# Patient Record
Sex: Male | Born: 1985 | Race: Black or African American | Hispanic: No | Marital: Single | Smoking: Current every day smoker
Health system: Southern US, Community
[De-identification: ages and names within clinical notes are randomized; demographics above are authoritative.]

## PROBLEM LIST (undated history)

## (undated) DIAGNOSIS — G43909 Migraine, unspecified, not intractable, without status migrainosus: Secondary | ICD-10-CM

## (undated) DIAGNOSIS — Z973 Presence of spectacles and contact lenses: Secondary | ICD-10-CM

## (undated) HISTORY — DX: Migraine, unspecified, not intractable, without status migrainosus: G43.909

## (undated) HISTORY — DX: Presence of spectacles and contact lenses: Z97.3

---
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
## 2007-07-13 ENCOUNTER — Emergency Department (HOSPITAL_COMMUNITY): Admission: EM | Admit: 2007-07-13 | Discharge: 2007-07-13 | Payer: Self-pay | Admitting: Emergency Medicine

## 2008-07-22 ENCOUNTER — Emergency Department (HOSPITAL_COMMUNITY): Admission: EM | Admit: 2008-07-22 | Discharge: 2008-07-22 | Payer: Self-pay | Admitting: Family Medicine

## 2009-09-13 ENCOUNTER — Emergency Department (HOSPITAL_COMMUNITY): Admission: EM | Admit: 2009-09-13 | Discharge: 2009-09-13 | Payer: Self-pay | Admitting: Emergency Medicine

## 2009-10-10 ENCOUNTER — Emergency Department (HOSPITAL_COMMUNITY): Admission: EM | Admit: 2009-10-10 | Discharge: 2009-10-10 | Payer: Self-pay | Admitting: Emergency Medicine

## 2011-01-23 ENCOUNTER — Emergency Department (HOSPITAL_COMMUNITY)
Admission: EM | Admit: 2011-01-23 | Discharge: 2011-01-23 | Disposition: A | Discharge status: Home / Self Care | Payer: Self-pay | Attending: Emergency Medicine | Admitting: Emergency Medicine

## 2011-01-23 DIAGNOSIS — F17200 Nicotine dependence, unspecified, uncomplicated: Secondary | ICD-10-CM | POA: Insufficient documentation

## 2011-01-23 DIAGNOSIS — K029 Dental caries, unspecified: Secondary | ICD-10-CM | POA: Insufficient documentation

## 2011-01-23 DIAGNOSIS — Z79899 Other long term (current) drug therapy: Secondary | ICD-10-CM | POA: Insufficient documentation

## 2011-01-23 DIAGNOSIS — F1210 Cannabis abuse, uncomplicated: Secondary | ICD-10-CM | POA: Insufficient documentation

## 2011-12-14 ENCOUNTER — Encounter (HOSPITAL_COMMUNITY): Payer: Self-pay | Admitting: *Deleted

## 2011-12-14 ENCOUNTER — Emergency Department (HOSPITAL_COMMUNITY)
Admission: EM | Admit: 2011-12-14 | Discharge: 2011-12-14 | Disposition: A | Discharge status: Home / Self Care | Payer: Self-pay | Attending: Emergency Medicine | Admitting: Emergency Medicine

## 2011-12-14 DIAGNOSIS — L0889 Other specified local infections of the skin and subcutaneous tissue: Secondary | ICD-10-CM | POA: Insufficient documentation

## 2011-12-14 DIAGNOSIS — K130 Diseases of lips: Secondary | ICD-10-CM

## 2011-12-14 DIAGNOSIS — F17200 Nicotine dependence, unspecified, uncomplicated: Secondary | ICD-10-CM | POA: Insufficient documentation

## 2011-12-14 MED ORDER — HYDROCODONE-ACETAMINOPHEN 5-325 MG PO TABS: ORAL_TABLET | ORAL | Status: AC

## 2011-12-14 MED ORDER — CEPHALEXIN 500 MG PO CAPS: 500.0000 mg | ORAL_CAPSULE | Freq: Four times a day (QID) | ORAL | Status: AC

## 2011-12-14 NOTE — ED Notes (Signed)
Pt reports abscess to upper lip x 2 days. No fever/chills. Also reports pain to right lower side of mouth.

## 2011-12-14 NOTE — ED Provider Notes (Signed)
History     CSN: [id number]  Arrival date & time 12/14/11  0818   First MD Initiated Contact with Patient 12/14/11 [phone]      Chief Complaint  Patient presents with  . Abscess    (Consider location/radiation/quality/duration/timing/severity/associated sxs/prior treatment) HPI Comments: Patient presents with complaint of abscess to his upper lip for the past 2 days. Patient has noticed increasing swelling and tenderness in his lip. He also had some dental pain in the left upper first incisor. Patient denies fever, nausea or vomiting, neck swelling, trouble breathing. Treated with ibuprofen with mild relief of pain. Onset was gradual. Course is gradually worsening. Nothing makes the pain better or worse. Patient has a history of dental abscess.  Patient is a 26 y.o. male presenting with abscess. The history is provided by the patient.  Abscess  This is a new problem. The current episode started yesterday. The onset was gradual. The problem has been gradually worsening. The problem is moderate. The abscess is characterized by painfulness. Pertinent negatives include no fever, no vomiting and no sore throat.    History reviewed. No pertinent past medical history.  History reviewed. No pertinent past surgical history.  No family history on file.  History  Substance Use Topics  . Smoking status: Current Everyday Smoker  . Smokeless tobacco: Not on file  . Alcohol Use: Yes      Review of Systems  Constitutional: Negative for fever.  HENT: Positive for dental problem. Negative for sore throat.   Respiratory: Negative for shortness of breath.   Gastrointestinal: Negative for nausea and vomiting.  Skin: Negative for color change.       Positive for abscess  Hematological: Negative for adenopathy.    Allergies  Review of patient's allergies indicates no known allergies.  Home Medications  No current outpatient prescriptions on file.  BP 133/80  Pulse 85  Temp 98.1 F (36.7  C) (Oral)  Resp 20  SpO2 98%  Physical Exam  Nursing note and vitals reviewed. Constitutional: He appears well-developed and well-nourished.  HENT:  Head: Normocephalic and atraumatic.  Mouth/Throat: Oropharynx is clear and moist and mucous membranes are normal. Dental abscesses present.       Patient with swelling to inner upper lip, midline, with 2cm area of soft tissue swelling and erythema. No definite induration.   Eyes: Conjunctivae are normal.  Neck: Normal range of motion. Neck supple.  Pulmonary/Chest: No respiratory distress.  Neurological: He is alert.  Skin: Skin is warm and dry.  Psychiatric: He has a normal mood and affect.    ED Course  Procedures (including critical care time)  Labs Reviewed - No data to display No results found.   1. Infection of lip     8:40 AM Patient seen and examined.   Vital signs reviewed and are as follows: Filed Vitals:   12/14/11 0822  BP: 133/80  Pulse: 85  Temp: 98.1 F (36.7 C)  Resp: 20   NEEDLE ASPIRATION Performed by: Carolee Rota Consent: Verbal consent obtained. Risks and benefits: risks, benefits and alternatives were discussed Type: abscess  Body area: upper lip  Anesthesia: local infiltration  Local anesthetic: lidocaine 2% without epinephrine  Anesthetic total: 1.5 ml  Needle: 18g  Drainage: none  Patient tolerance: Patient tolerated the procedure well with no immediate complications.  9:12 AM The patient was urged to return to the Emergency Department urgently with worsening pain, swelling, expanding erythema especially if it streaks away from the affected area, fever, or  if they have any other concerns.   The patient was urged to return to the Emergency Department or go to their PCP in 48 hours for wound recheck if the area is not significantly improved.  The patient verbalized understanding and stated agreement with this plan.   9:12 AM Patient counseled on use of narcotic pain medications.  Counseled not to combine these medications with others containing tylenol. Urged not to drink alcohol, drive, or perform any other activities that requires focus while taking these medications. The patient verbalizes understanding and agrees with the plan.   MDM  Upper lip infection. Needle aspiration did not produce any drainage. Possibly early abscess vs soft tissue infection. Will treat with keflex, pain medicine, Appropriate return instructions given.         [place], [place] 12/14/11 [phone]

## 2011-12-16 NOTE — ED Provider Notes (Signed)
Medical screening examination/treatment/procedure(s) were performed by non-physician practitioner and as supervising physician I was immediately available for consultation/collaboration.  Taleigha Pinson, MD 12/16/11 1707 

## 2012-08-23 ENCOUNTER — Ambulatory Visit (INDEPENDENT_AMBULATORY_CARE_PROVIDER_SITE_OTHER): Payer: BC Managed Care – PPO | Admitting: Medical

## 2012-08-23 ENCOUNTER — Encounter: Payer: Self-pay | Admitting: Medical

## 2012-08-23 VITALS — BP 110/70 | HR 82 | Temp 98.0°F | Resp 16 | Wt 200.0 lb

## 2012-08-23 DIAGNOSIS — B029 Zoster without complications: Secondary | ICD-10-CM

## 2012-08-23 MED ORDER — VALACYCLOVIR HCL 1 G PO TABS: 1000.0000 mg | ORAL_TABLET | Freq: Three times a day (TID) | ORAL | Status: DC

## 2012-08-23 NOTE — Patient Instructions (Signed)

## 2012-08-23 NOTE — Progress Notes (Signed)
Subjective:  Walter Huff is a 27 y.o. male who presents as a new patient with c/o rash.  He reports about 3 day hx/o rash that started on his back and around abdomen on the right.  No prior similar.  Rash does hurt when shirt rubs it.  No hx/o underlying immunosuppression, no hx/o HIV, diabetes, anemia.   He is married, has 2 young children.  No sick contacts with same.   He did have a common cold recently.   No fever, myalgias, arthralgias, NVD, no headache.  Otherwise has been in usual state of health. No other aggravating or relieving factors.    No other c/o.  The following portions of the patient's history were reviewed and updated as appropriate: allergies, current medications, past family history, past medical history, past social history, past surgical history and problem list.  ROS Otherwise as in subjective above  Objective: Physical Exam  Vital signs reviewed  General appearance: alert, no distress, WD/WN, lean AA male Skin: T11-T12 dermatome pattern of vesicular lesions on erythematous base in 2 large patches, right lower abdomen and right lower lateral back in linear dermatome fashion Neck: supple, no lymphadenopathy, no thyromegaly, no masses Abdomen: +bs, soft, non tender, non distended, no masses, no hepatomegaly, no splenomegaly Pulses: 2+ radial pulses, 2+ pedal pulses, normal cap refill Ext: no edema   Assessment: Encounter Diagnosis  Name Primary?  . Shingles Yes    Plan: Discussed diagnosis, precautions, possibility of recurrence and post herpetic neuralgia, means of transmission.  Can use Ibuprofen for pain, begin Valtrex, take precautions to avoid his children being exposed to rash, wash hands frequently.  Declines labs today, but will return soon for physical, baseline labs, STD screening, other.

## 2016-08-13 ENCOUNTER — Emergency Department (HOSPITAL_COMMUNITY)
Admission: EM | Admit: 2016-08-13 | Discharge: 2016-08-13 | Disposition: A | Discharge status: Home / Self Care | Payer: Managed Care, Other (non HMO) | Attending: Emergency Medicine | Admitting: Emergency Medicine

## 2016-08-13 ENCOUNTER — Encounter (HOSPITAL_COMMUNITY): Payer: Self-pay | Admitting: Emergency Medicine

## 2016-08-13 DIAGNOSIS — T25211A Burn of second degree of right ankle, initial encounter: Secondary | ICD-10-CM | POA: Insufficient documentation

## 2016-08-13 DIAGNOSIS — F17200 Nicotine dependence, unspecified, uncomplicated: Secondary | ICD-10-CM | POA: Insufficient documentation

## 2016-08-13 DIAGNOSIS — T25011A Burn of unspecified degree of right ankle, initial encounter: Secondary | ICD-10-CM | POA: Diagnosis present

## 2016-08-13 DIAGNOSIS — Y990 Civilian activity done for income or pay: Secondary | ICD-10-CM | POA: Diagnosis not present

## 2016-08-13 DIAGNOSIS — Y929 Unspecified place or not applicable: Secondary | ICD-10-CM | POA: Insufficient documentation

## 2016-08-13 DIAGNOSIS — X118XXA Contact with other hot tap-water, initial encounter: Secondary | ICD-10-CM | POA: Diagnosis not present

## 2016-08-13 DIAGNOSIS — Y939 Activity, unspecified: Secondary | ICD-10-CM | POA: Insufficient documentation

## 2016-08-13 MED ORDER — CEPHALEXIN 500 MG PO CAPS: 500.0000 mg | ORAL_CAPSULE | Freq: Four times a day (QID) | ORAL | 0 refills | Status: DC

## 2016-08-13 NOTE — ED Provider Notes (Signed)
[facility]-EMERGENCY DEPT Provider Note   CSN: [id number] Arrival date & time: 08/13/16  1846     History   Chief Complaint Chief Complaint  Patient presents with  . Burn  . Foot Pain    HPI Walter Huff is a 31 y.o. male.  HPI   31 year old male presents today with a burn to his right foot and ankle.  Patient reports that he had hot water spilled on his boot last night at work.  He notes immediate pain and redness to the area.  Patient notes blistering today.  He notes also a small area to the posterior calf as well.  Patient reports pain is very minor, no signs of surrounding redness, no change in perfusion to his toes.  Full active range of motion of the ankle foot and toes.  Past Medical History:  Diagnosis Date  . Migraine   . Wears glasses     There are no active problems to display for this patient.   History reviewed. No pertinent surgical history.     Home Medications    Prior to Admission medications   Medication Sig Start Date End Date Taking? Authorizing Provider  cephALEXin (KEFLEX) 500 MG capsule Take 1 capsule (500 mg total) by mouth 4 (four) times daily. 08/13/16   Eyvonne Mechanic, PA-C  valACYclovir (VALTREX) 1000 MG tablet Take 1 tablet (1,000 mg total) by mouth 3 (three) times daily. 08/23/12   Jac Canavan, PA-C    Family History No family history on file.  Social History Social History  Substance Use Topics  . Smoking status: Current Every Day Smoker  . Smokeless tobacco: Not on file  . Alcohol use Yes     Allergies   Pineapple   Review of Systems Review of Systems  All other systems reviewed and are negative.    Physical Exam Updated Vital Signs BP 132/70 (BP Location: Left Arm)   Pulse 71   Temp 98 F (36.7 C) (Oral)   Resp 20   SpO2 98%   Physical Exam  Constitutional: He is oriented to person, place, and time. He appears well-developed and well-nourished.  HENT:  Head: Normocephalic and atraumatic.  Eyes:  Conjunctivae are normal. Pupils are equal, round, and reactive to light. Right eye exhibits no discharge. Left eye exhibits no discharge. No scleral icterus.  Neck: Normal range of motion. No JVD present. No tracheal deviation present.  Pulmonary/Chest: Effort normal. No stridor.  Neurological: He is alert and oriented to person, place, and time. Coordination normal.  Psychiatric: He has a normal mood and affect. His behavior is normal. Judgment and thought content normal.  Nursing note and vitals reviewed.       Non-circumferential, no pain with flexion or extension of the foot or toes Refill intact  Small additional area involvement to the posterior calf   ED Treatments / Results  Labs (all labs ordered are listed, but only abnormal results are displayed) Labs Reviewed - No data to display  EKG  EKG Interpretation None       Radiology No results found.  Procedures Procedures (including critical care time)  Medications Ordered in ED Medications - No data to display   Initial Impression / Assessment and Plan / ED Course  I have reviewed the triage vital signs and the nursing notes.  Pertinent labs & imaging results that were available during my care of the patient were reviewed by me and considered in my medical decision making (see chart for details).  Final Clinical Impressions(s) / ED Diagnoses   Final diagnoses:  Partial thickness burn of right ankle, initial encounter     Assessment/Plan: 31 year old male presents today with a burn to the dorsum of his foot and ankle.  This is not circumferential, this is second-degree, very minor pain.  Patient has no signs of surrounding infection, distal perfusion intact.  Patient is instructed to continue wound care with Neosporin, gauze bandaging.  Patient given follow-up information, wound care instructions and strict return precautions.  He was supplied a prescription for Keflex in the event that redness began to  present at which he will follow-up immediately.  Patient verbalized understanding and agreement to today's plan had no further questions or concerns at time discharge      New Prescriptions New Prescriptions   CEPHALEXIN (KEFLEX) 500 MG CAPSULE    Take 1 capsule (500 mg total) by mouth 4 (four) times daily.     Eyvonne Mechanic, PA-C 08/13/16 2020    Maia Plan, MD 08/13/16 [phone]

## 2016-08-13 NOTE — ED Triage Notes (Signed)
Pt complaint of right ankle pain related to burn; dropped hot water straight on foot; large blister noted.

## 2016-08-13 NOTE — ED Notes (Signed)
Bed: [facility] Expected date:  Expected time:  Means of arrival:  Comments: 

## 2016-08-13 NOTE — Discharge Instructions (Signed)
Please read attached information. If you experience any new or worsening signs or symptoms please return to the emergency room for evaluation. Please follow-up with your primary care provider or specialist as discussed. Please use medication prescribed only as directed and discontinue taking if you have any concerning signs or symptoms.   °

## 2016-09-02 ENCOUNTER — Encounter (HOSPITAL_BASED_OUTPATIENT_CLINIC_OR_DEPARTMENT_OTHER): Payer: Managed Care, Other (non HMO) | Attending: Nurse Practitioner

## 2016-09-02 DIAGNOSIS — T25211A Burn of second degree of right ankle, initial encounter: Secondary | ICD-10-CM | POA: Insufficient documentation

## 2016-09-02 DIAGNOSIS — X118XXA Contact with other hot tap-water, initial encounter: Secondary | ICD-10-CM | POA: Diagnosis not present

## 2016-09-02 DIAGNOSIS — T25211D Burn of second degree of right ankle, subsequent encounter: Secondary | ICD-10-CM | POA: Insufficient documentation

## 2016-09-02 DIAGNOSIS — F17200 Nicotine dependence, unspecified, uncomplicated: Secondary | ICD-10-CM | POA: Diagnosis not present

## 2016-09-09 DIAGNOSIS — T25211D Burn of second degree of right ankle, subsequent encounter: Secondary | ICD-10-CM | POA: Diagnosis not present

## 2016-09-16 DIAGNOSIS — T25211D Burn of second degree of right ankle, subsequent encounter: Secondary | ICD-10-CM | POA: Diagnosis not present

## 2016-09-23 DIAGNOSIS — T25211D Burn of second degree of right ankle, subsequent encounter: Secondary | ICD-10-CM | POA: Diagnosis not present

## 2016-09-30 DIAGNOSIS — T25211D Burn of second degree of right ankle, subsequent encounter: Secondary | ICD-10-CM | POA: Diagnosis not present

## 2016-10-07 ENCOUNTER — Encounter (HOSPITAL_BASED_OUTPATIENT_CLINIC_OR_DEPARTMENT_OTHER): Payer: Managed Care, Other (non HMO) | Attending: Internal Medicine

## 2016-10-07 DIAGNOSIS — X118XXA Contact with other hot tap-water, initial encounter: Secondary | ICD-10-CM | POA: Insufficient documentation

## 2016-10-07 DIAGNOSIS — T25211A Burn of second degree of right ankle, initial encounter: Secondary | ICD-10-CM | POA: Insufficient documentation

## 2016-10-14 DIAGNOSIS — T25211A Burn of second degree of right ankle, initial encounter: Secondary | ICD-10-CM | POA: Diagnosis not present

## 2018-10-23 ENCOUNTER — Other Ambulatory Visit: Payer: Self-pay | Admitting: Orthopedic Surgery

## 2018-10-23 DIAGNOSIS — M25662 Stiffness of left knee, not elsewhere classified: Secondary | ICD-10-CM

## 2018-10-23 DIAGNOSIS — M25462 Effusion, left knee: Secondary | ICD-10-CM

## 2018-10-23 DIAGNOSIS — M25562 Pain in left knee: Secondary | ICD-10-CM

## 2018-10-24 ENCOUNTER — Ambulatory Visit
Admission: RE | Admit: 2018-10-24 | Discharge: 2018-10-24 | Disposition: A | Discharge status: Home / Self Care | Payer: 59 | Source: Ambulatory Visit | Attending: Orthopedic Surgery | Admitting: Orthopedic Surgery

## 2018-10-24 ENCOUNTER — Other Ambulatory Visit: Payer: Self-pay

## 2018-10-24 DIAGNOSIS — M25462 Effusion, left knee: Secondary | ICD-10-CM

## 2018-10-24 DIAGNOSIS — M25662 Stiffness of left knee, not elsewhere classified: Secondary | ICD-10-CM

## 2018-10-24 DIAGNOSIS — M25562 Pain in left knee: Secondary | ICD-10-CM

## 2018-12-27 ENCOUNTER — Encounter: Payer: Self-pay | Admitting: Neurology

## 2018-12-27 ENCOUNTER — Other Ambulatory Visit: Payer: Self-pay | Admitting: Neurology

## 2019-01-01 ENCOUNTER — Other Ambulatory Visit: Payer: Self-pay

## 2019-01-01 ENCOUNTER — Encounter: Payer: Self-pay | Admitting: Neurology

## 2019-01-01 ENCOUNTER — Telehealth: Payer: Self-pay

## 2019-01-01 ENCOUNTER — Ambulatory Visit (INDEPENDENT_AMBULATORY_CARE_PROVIDER_SITE_OTHER): Payer: 59 | Admitting: Neurology

## 2019-01-01 VITALS — BP 124/84 | HR 96 | Temp 97.8°F | Ht 73.0 in | Wt 245.3 lb

## 2019-01-01 DIAGNOSIS — R0683 Snoring: Secondary | ICD-10-CM | POA: Diagnosis not present

## 2019-01-01 DIAGNOSIS — G4719 Other hypersomnia: Secondary | ICD-10-CM

## 2019-01-01 DIAGNOSIS — G4726 Circadian rhythm sleep disorder, shift work type: Secondary | ICD-10-CM | POA: Diagnosis not present

## 2019-01-01 NOTE — Progress Notes (Signed)
SLEEP MEDICINE CLINIC    Provider:  Melvyn Novasarmen  Gerard Bonus, MD  Primary Care Physician:  Jac Canavanysinger, David S, PA-C [address] [place] [id number]     Referring Provider: Dr. Annalee GentaShoemaker.  ENT        Chief Complaint according to patient   Patient presents with:    . New Patient (Initial Visit)           HISTORY OF PRESENT ILLNESS:  Walter Huff is a 33 y.o. year old African American male patient seen here  Face to face  on 01/01/2019, upon referral from dr. Annalee GentaShoemaker for a sleep evaluation.  Chief concern according to patient : "My wife recorded my snoring."   I have the pleasure of seeing Walter Huff today, a right-handed [place] male who presented a recording of him snoring- crescendo snoring. I listened to the recording - loud snoring gets louder and is followed by silence. He  has a past medical history of Migraine and Wears glasses. An active smoker, hehas recently a meniscus tear and effusion in the left knee and wears an external fixator.     Sleep relevant medical history: he underwent ENT evaluation, findings of 2 plus tonsillary hypertrophy.   Family medical /sleep history:No other family member with OSA, mother is suspected to have OSA in the process of work up.    Social history:  Patient is out of work since injuring his knee first week of June. He is working at a [employer], [employer] , and on the floor expected to walk, lift and stand. He lives in a household with 4 persons. Family status is married, with 2 children, and 4 dogs. The patient works in shifts( night/ rotating,) Tobacco use- cigarretes 1 ppd.  ETOH use : beer on weekends, not more than 3 a month.   Caffeine intake in form of Coffee( 2 cups a day ) Soda( 2-3) Tea ( rarely, in restaurants )  And 2 red bulls every night at work- energy drinks.  Regular exercise in form : not able to now. In PT.  Hobbies ; dogs.    Sleep habits are as follows:  since June he hasn't worked nights- he  has worked nights for 7 years  The patient's dinner time is between 7-8 PM. The patient goes to bed at 2-3 AM and continues to sleep for 6 hours, wakes rarely bathroom breaks.   The preferred sleep position is supine but he starts off on his side, with the support of 2 pillows. Dreams are reportedly rare now.  7 AM is the usual rise time. The patient wakes up spontaneously between 7-8 AM..  He reports not feeling refreshed or restored in AM, with symptoms such as dry mouth , morning headaches , and residual fatigue. Naps are taken infrequently, and right now he can take naps, but rarely needs one.    Review of Systems: Out of a complete 14 system review, the patient complains of only the following symptoms, and all other reviewed systems are negative.:  Fatigue, sleepiness , shift worker= snoring,   How likely are you to doze in the following situations: 0 = not likely, 1 = slight chance, 2 = moderate chance, 3 = high chance   Sitting and Reading? Watching Television? Sitting inactive in a public place (theater or meeting)? As a passenger in a car for an hour without a break? Lying down in the afternoon when circumstances permit? Sitting and talking to someone?  Sitting quietly after lunch without alcohol? In a car, while stopped for a few minutes in traffic?   Total = 15/ 24 points   shift work related sleepiness- all activities in daytime left him sleepy    Social History   Socioeconomic History  . Marital status: Single    Spouse name: Not on file  . Number of children: Not on file  . Years of education: Not on file  . Highest education level: Not on file  Occupational History  . Not on file  Social Needs  . Financial resource strain: Not on file  . Food insecurity    Worry: Not on file    Inability: Not on file  . Transportation needs    Medical: Not on file    Non-medical: Not on file  Tobacco Use  . Smoking status: Current Every Day Smoker    Types: Cigarettes  .  Smokeless tobacco: Never Used  Substance and Sexual Activity  . Alcohol use: Yes    Comment: occasional  . Drug use: Not Currently    Types: Marijuana  . Sexual activity: Not on file  Lifestyle  . Physical activity    Days per week: Not on file    Minutes per session: Not on file  . Stress: Not on file  Relationships  . Social Musician on phone: Not on file    Gets together: Not on file    Attends religious service: Not on file    Active member of club or organization: Not on file    Attends meetings of clubs or organizations: Not on file    Relationship status: Not on file  Other Topics Concern  . Not on file  Social History Narrative   1-2 cups of caffeine per day     History reviewed. No pertinent family history.  Past Medical History:  Diagnosis Date  . Migraine   . Wears glasses     Past Surgical History:  Procedure Laterality Date  . KNEE SURGERY Left      Allergies  Allergen Reactions  . Hydrocodone-Acetaminophen Itching  . Pineapple Itching and Swelling    Physical exam:  Today's Vitals   01/01/19 0909  BP: 124/84  Pulse: 96  Temp: 97.8 F (36.6 C)  TempSrc: Temporal  SpO2: 97%  Weight: 245 lb 5 oz (111.3 kg)  Height: 6\' 1"  (1.854 m)   Body mass index is 32.37 kg/m.   Wt Readings from Last 3 Encounters:  01/01/19 245 lb 5 oz (111.3 kg)  08/23/12 200 lb (90.7 kg)     Ht Readings from Last 3 Encounters:  01/01/19 6\' 1"  (1.854 m)      General: The patient is awake, alert and appears not in acute distress. The patient is well groomed. Head: Normocephalic, atraumatic. Neck is supple. Mallampati 3 ,  neck circumference:17 inches . Nasal airflow congested -  Retrognathia is seen.  Tonsills present.  Dental status: intact Cardiovascular:  Regular rate and cardiac rhythm by pulse,  without distended neck veins. Respiratory: Lungs are clear to auscultation.  Skin:  Without evidence of ankle edema, or rash. tattooed. Trunk: The  patient's posture is erect.   Neurologic exam : The patient is awake and alert, oriented to place and time.   Memory subjective described as intact.  Attention span & concentration ability appears normal.  Speech is fluent,  without  dysarthria, dysphonia or aphasia.  Mood and affect are appropriate.   Cranial nerves:  no loss of smell or taste reported  Pupils are equal and briskly reactive to light. Funduscopic exam deferred. .  Extraocular movements in vertical and horizontal planes were intact and without nystagmus. No Diplopia. Visual fields by finger perimetry are intact. Hearing was intact to soft voice and finger rubbing. Ears are clear-     Facial sensation intact to fine touch.  Facial motor strength is symmetric and tongue and uvula move midline.  Neck ROM : rotation, tilt and flexion extension were normal for age and shoulder shrug was symmetrical.    Motor exam:  Symmetric bulk, tone and ROM.   Normal tone without cog wheeling, symmetric grip strength .   Sensory:  Fine touch, pinprick and vibration were tested and normal.  Proprioception tested in the upper extremities was normal.   Coordination: Rapid alternating movements in the fingers/hands were of normal speed.  The Finger-to-nose maneuver was intact without evidence of ataxia, dysmetria or tremor.   Gait and station: Patient could rise unassisted from a seated position, walked without assistive device.  Stance is of normal width/ base and the patient turned with 3 steps.  Toe and heel walk were deferred.  Deep tendon reflexes: deferred.       After spending a total time of  30 minutes face to face and additional time for physical and neurologic examination, review of laboratory studies,  personal review of imaging studies, reports and results of other testing and review of referral information / records as far as provided in visit, I have established the following assessments:  1)  I follow Dr. Victorio Palm lead  and will evaluate Mr. Gough for obstructive sleep apnea.  As we have heard from the video recording he is a loud snorer and there is crescendo snoring noted but I did not see any frank apnea.  He did have a high Epworth sleepiness score the diagnosis would definitely be excessive daytime sleepiness and he has a shiftwork sleep disorder so many people that have worked for almost a decade the night shift.  He worked night shift until very recently and is currently in the process of resuming normal daytime activities.    The patient would be an ideal candidate for a home sleep test. He may benefit from modafinil as a sleepy shiftworker with or without  Apnea.      My Plan is to proceed with:  1)Modafinil 1/2 200mg  tab 2) HST    I would like to thank Jerrell Belfast, MD for allowing me to meet with and to take care of this pleasant patient.   In short, Walter Huff is presenting with EDS, a symptom that can be attributed to shift work and OSA   I plan to follow up either personally or through our NP within 2-3 month.   Electronically signed by: Larey Seat, MD 01/01/2019 9:16 AM  [facility] Board certified by The [employer] of Sleep Medicine and Diplomate of the [employer] of Sleep Medicine. Board certified In Neurology through the [employer], Fellow of the [employer] of Neurology. Medical Director of [facility].

## 2019-01-01 NOTE — Telephone Encounter (Signed)
Pt declined scheduling a f/u with Dr. Brett Fairy until after the sleep study has been completed.

## 2019-01-04 ENCOUNTER — Ambulatory Visit (INDEPENDENT_AMBULATORY_CARE_PROVIDER_SITE_OTHER): Payer: 59 | Admitting: Otolaryngology

## 2019-01-22 ENCOUNTER — Telehealth: Payer: Self-pay

## 2019-01-22 NOTE — Telephone Encounter (Signed)
We have attempted to call the patient two times to schedule sleep study.  Patient has been unavailable at the phone numbers we have on file and has not returned our calls. If patient calls back we will schedule them for their sleep study.  

## 2019-11-10 IMAGING — MR MRI OF THE LEFT KNEE WITHOUT CONTRAST
6 series · 38 of 40 positions shown · non-contrast
Comparison: None.

CLINICAL DATA: Diffuse left knee pain with weight-bearing after an
injury stretching 10/07/2018.

EXAM:
MRI OF THE LEFT KNEE WITHOUT CONTRAST
TECHNIQUE: Multiplanar, multisequence MR imaging of the knee was performed. No
intravenous contrast was administered.

[Series 5: T2 fat-sat · axial · left · 4.0mm · 0.50mm/px · z∈[-71,+69]mm · 8 of 33 slices shown (1 of 3)]
[im 1/33]
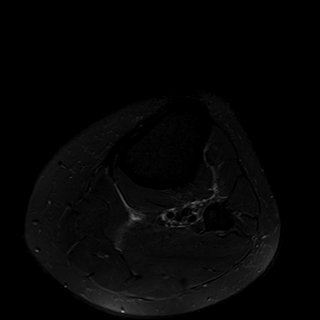
[im 5/33]
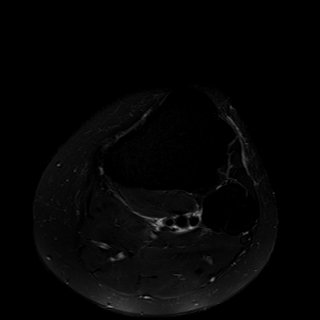
[im 10/33]
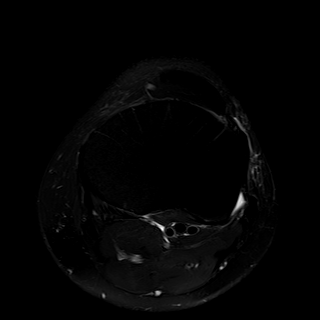
[im 14/33]
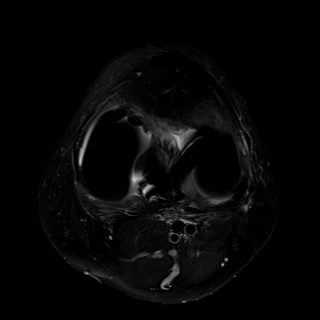
[im 19/33]
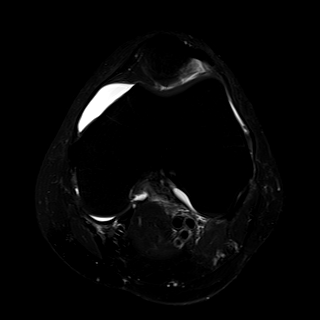
[im 23/33]
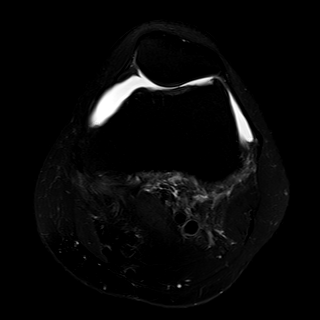
[im 28/33]
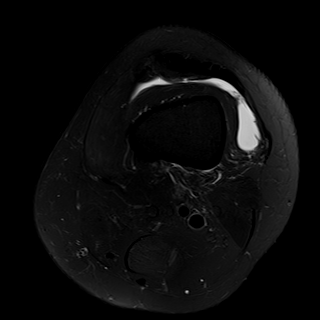
[im 33/33]
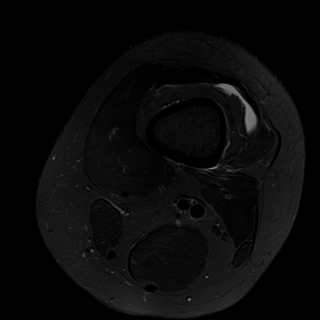

[Series 6: T2 fat-sat · coronal · left · 4.0mm · 0.39mm/px · 6 of 28 slices shown (2 of 3)]
[im 1/28]
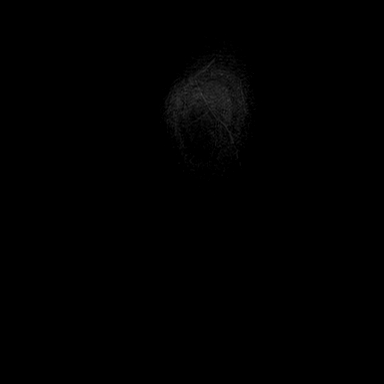
[im 6/28]
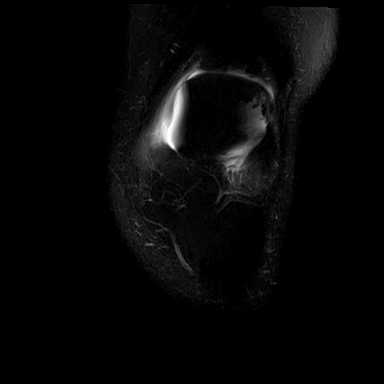
[im 11/28]
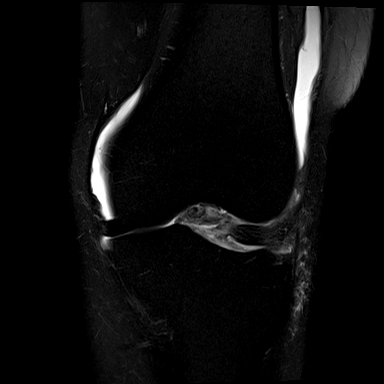
[im 17/28]
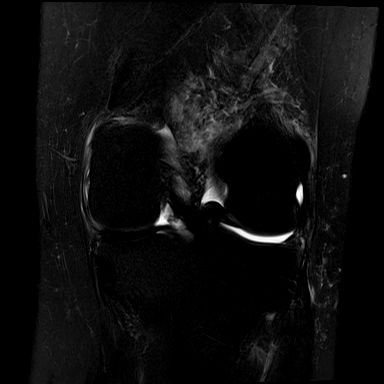
[im 22/28]
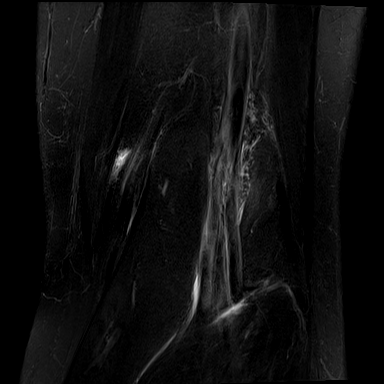
[im 28/28]
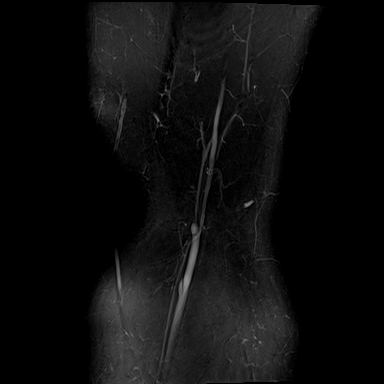

[Series 7: T1 · coronal · left · 4.0mm · 0.39mm/px · 4 of 28 slices shown]
[im 1/28]
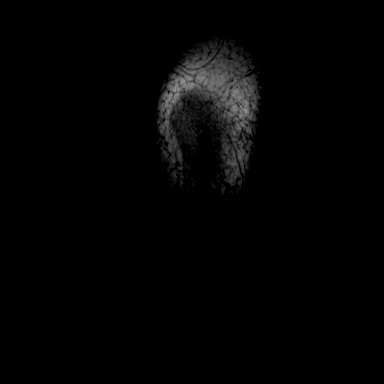
[im 6/28]
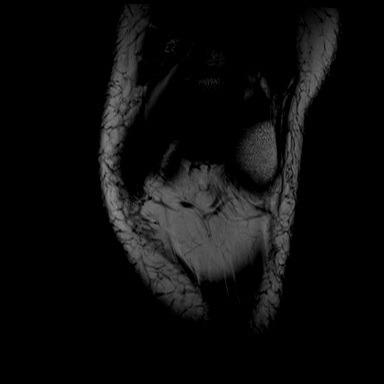
[im 11/28]
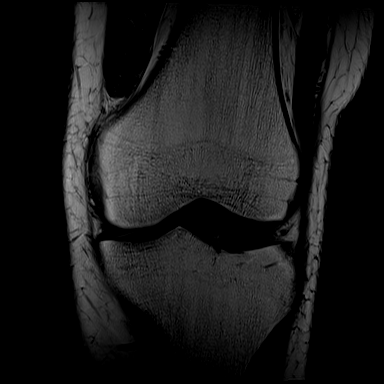
[im 17/28]
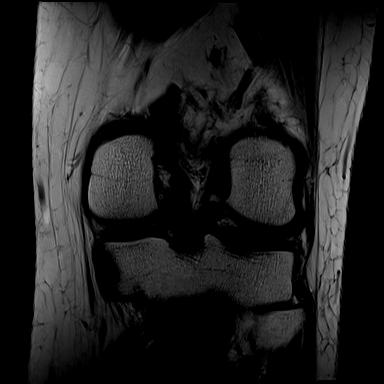

[Series 8: PD fat-sat · coronal · left · 3.0mm · 0.47mm/px · 8 of 34 slices shown (1 of 2)]
[im 1/34]
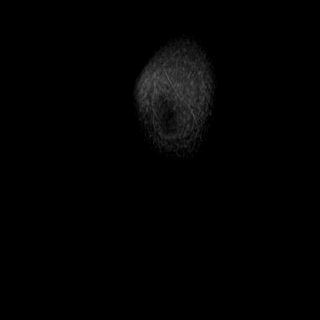
[im 5/34]
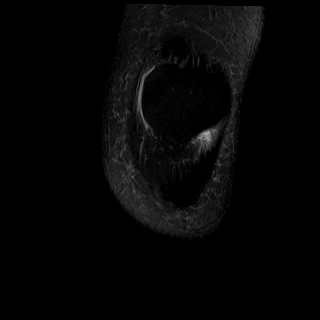
[im 10/34]
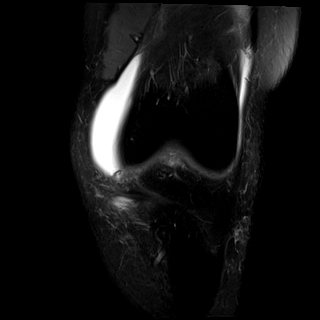
[im 15/34]
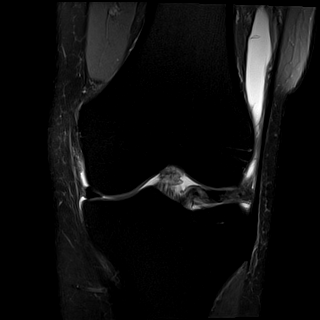
[im 19/34]
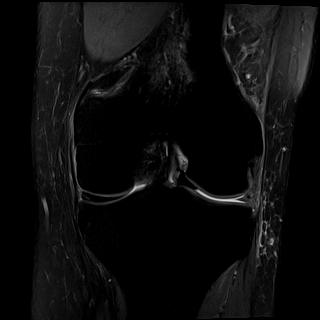
[im 24/34]
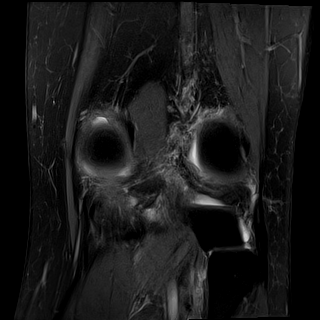
[im 29/34]
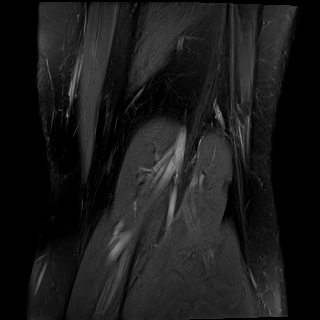
[im 34/34]
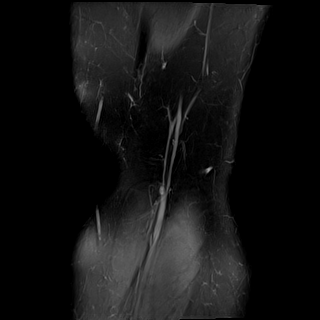

[Series 9: PD fat-sat · sagittal · left · 3.0mm · 0.39mm/px · 6 of 27 slices shown (2 of 2)]
[im 1/27]
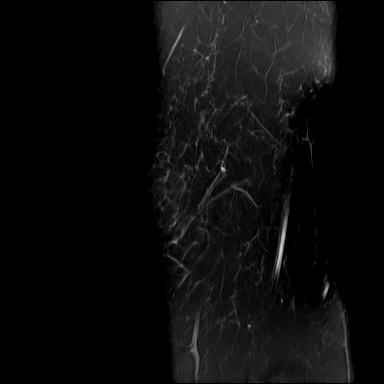
[im 6/27]
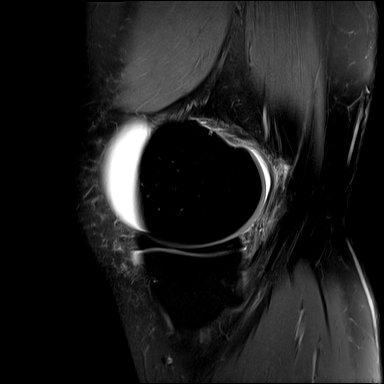
[im 11/27]
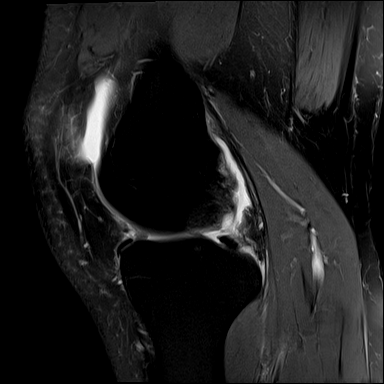
[im 16/27]
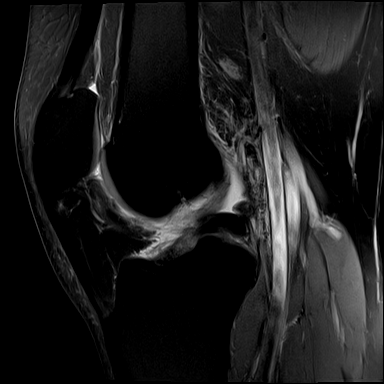
[im 21/27]
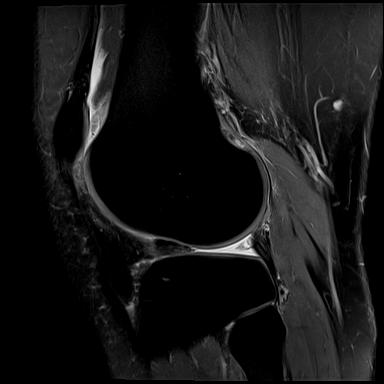
[im 27/27]
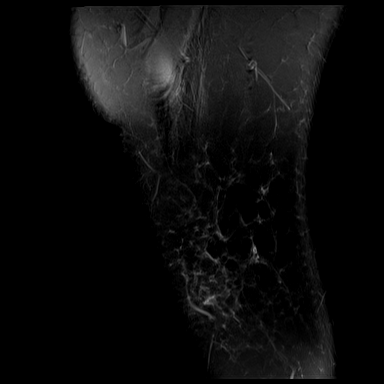

[Series 10: T2 fat-sat · sagittal · left · 3.0mm · 0.39mm/px · 6 of 27 slices shown (3 of 3)]
[im 1/27]
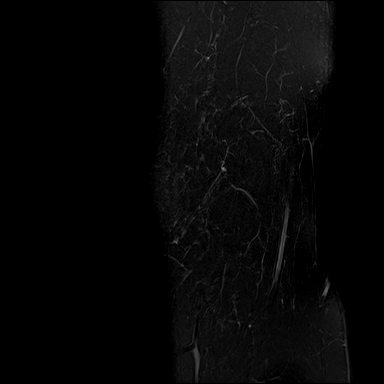
[im 6/27]
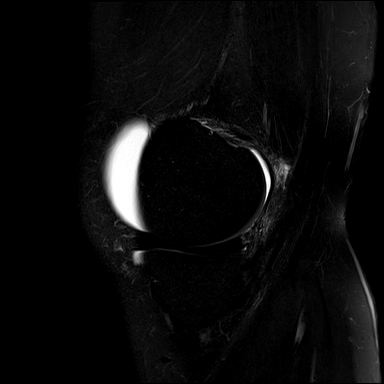
[im 11/27]
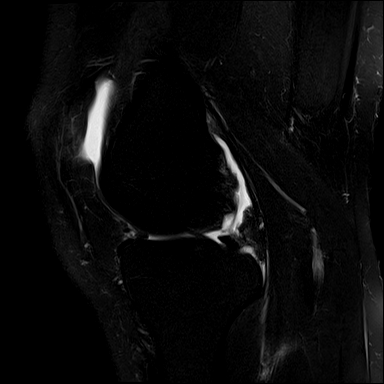
[im 16/27]
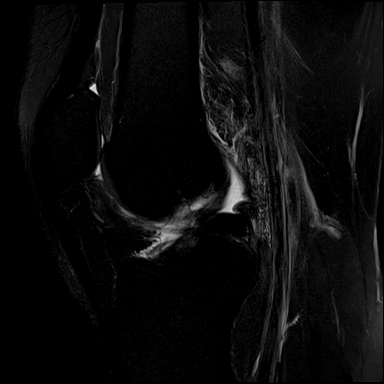
[im 21/27]
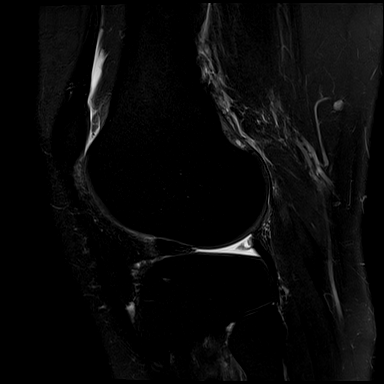
[im 27/27]
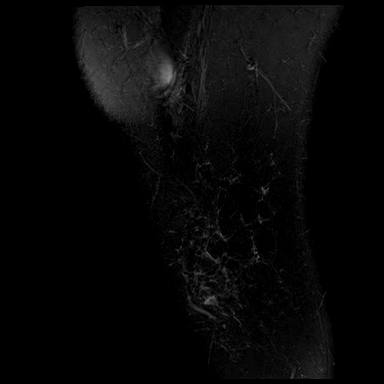

[38 of 40 positions shown; findings below may reference images not displayed]

FINDINGS: MENISCI

Medial meniscus:  Intact.

Lateral meniscus: Bucket-handle tear is present with almost the
entire posterior horn and body flipped centrally and behind the
anterior horn.

LIGAMENTS

Cruciates:  Intact.

Collaterals:  Intact.

CARTILAGE

Patellofemoral:  Preserved.

Medial:  Preserved.

Lateral:  Preserved.

Joint: Small effusion. There is edema in Hoffa's fat off the
inferior pole of the lateral patellar facet.

Popliteal Fossa:  No Baker's cyst.

Extensor Mechanism:  Intact.

Bones:  No fracture or worrisome lesion.

Other: None.
IMPRESSION: Bucket-handle tear of the lateral meniscus with almost the entire
posterior horn and body flipped centrally and behind the anterior
horn.

Edema in Hoffa's fat off the inferior pole of the lateral patella
compatible with patellar tendon-lateral femoral condyle friction
syndrome.
